# Patient Record
Sex: Male | Born: 1995 | Race: Black or African American | Hispanic: No | Marital: Single | Smoking: Never smoker
Health system: Southern US, Community
[De-identification: ages and names within clinical notes are randomized; demographics above are authoritative.]

## PROBLEM LIST (undated history)

## (undated) DIAGNOSIS — D550 Anemia due to glucose-6-phosphate dehydrogenase [G6PD] deficiency: Secondary | ICD-10-CM

---

## 2017-12-05 ENCOUNTER — Other Ambulatory Visit: Payer: Self-pay

## 2017-12-05 ENCOUNTER — Emergency Department (HOSPITAL_COMMUNITY)
Admission: EM | Admit: 2017-12-05 | Discharge: 2017-12-05 | Disposition: A | Discharge status: Home / Self Care | Payer: Self-pay | Attending: Emergency Medicine | Admitting: Emergency Medicine

## 2017-12-05 ENCOUNTER — Emergency Department (HOSPITAL_COMMUNITY): Payer: Self-pay

## 2017-12-05 ENCOUNTER — Encounter (HOSPITAL_COMMUNITY): Payer: Self-pay

## 2017-12-05 DIAGNOSIS — Y9259 Other trade areas as the place of occurrence of the external cause: Secondary | ICD-10-CM | POA: Insufficient documentation

## 2017-12-05 DIAGNOSIS — S0001XA Abrasion of scalp, initial encounter: Secondary | ICD-10-CM | POA: Insufficient documentation

## 2017-12-05 DIAGNOSIS — S0990XA Unspecified injury of head, initial encounter: Secondary | ICD-10-CM

## 2017-12-05 DIAGNOSIS — Y939 Activity, unspecified: Secondary | ICD-10-CM | POA: Insufficient documentation

## 2017-12-05 DIAGNOSIS — Y999 Unspecified external cause status: Secondary | ICD-10-CM | POA: Insufficient documentation

## 2017-12-05 DIAGNOSIS — S8000XA Contusion of unspecified knee, initial encounter: Secondary | ICD-10-CM | POA: Insufficient documentation

## 2017-12-05 DIAGNOSIS — S01511A Laceration without foreign body of lip, initial encounter: Secondary | ICD-10-CM | POA: Insufficient documentation

## 2017-12-05 HISTORY — DX: Anemia due to glucose-6-phosphate dehydrogenase (g6pd) deficiency: D55.0

## 2017-12-05 LAB — COMPREHENSIVE METABOLIC PANEL
ALT: 13 U/L (ref 0–44)
AST: 31 U/L (ref 15–41)
Albumin: 4.5 g/dL (ref 3.5–5.0)
Alkaline Phosphatase: 47 U/L (ref 38–126)
Anion gap: 13 (ref 5–15)
BUN: 13 mg/dL (ref 6–20)
CHLORIDE: 104 mmol/L (ref 98–111)
CO2: 23 mmol/L (ref 22–32)
Calcium: 9.6 mg/dL (ref 8.9–10.3)
Creatinine, Ser: 1.08 mg/dL (ref 0.61–1.24)
Glucose, Bld: 95 mg/dL (ref 70–99)
POTASSIUM: 4.2 mmol/L (ref 3.5–5.1)
Sodium: 140 mmol/L (ref 135–145)
Total Bilirubin: 1.2 mg/dL (ref 0.3–1.2)
Total Protein: 6.9 g/dL (ref 6.5–8.1)

## 2017-12-05 LAB — DIFFERENTIAL
ABS IMMATURE GRANULOCYTES: 0.1 10*3/uL (ref 0.0–0.1)
BASOS ABS: 0.1 10*3/uL (ref 0.0–0.1)
Basophils Relative: 0 %
EOS PCT: 0 %
Eosinophils Absolute: 0 10*3/uL (ref 0.0–0.7)
IMMATURE GRANULOCYTES: 1 %
LYMPHS ABS: 1.4 10*3/uL (ref 0.7–4.0)
LYMPHS PCT: 7 %
Monocytes Absolute: 1.3 10*3/uL — ABNORMAL HIGH (ref 0.1–1.0)
Monocytes Relative: 7 %
Neutro Abs: 16.4 10*3/uL — ABNORMAL HIGH (ref 1.7–7.7)
Neutrophils Relative %: 85 %

## 2017-12-05 LAB — CBC
HCT: 45.8 % (ref 39.0–52.0)
HEMOGLOBIN: 14.9 g/dL (ref 13.0–17.0)
MCH: 31.1 pg (ref 26.0–34.0)
MCHC: 32.5 g/dL (ref 30.0–36.0)
MCV: 95.6 fL (ref 78.0–100.0)
Platelets: 177 10*3/uL (ref 150–400)
RBC: 4.79 MIL/uL (ref 4.22–5.81)
RDW: 13.1 % (ref 11.5–15.5)
WBC: 19.2 10*3/uL — AB (ref 4.0–10.5)

## 2017-12-05 LAB — I-STAT TROPONIN, ED: TROPONIN I, POC: 0.01 ng/mL (ref 0.00–0.08)
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
## 2017-12-05 NOTE — Discharge Instructions (Addendum)
Ibuprofen 600 mg every 6 hours as needed for pain.  Return to the ER if you experience any new and/or concerning symptoms.
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
## 2017-12-05 NOTE — ED Notes (Signed)
Pt discharged from ED; instructions provided; Pt encouraged to return to ED if symptoms worsen and to f/u with PCP; Pt verbalized understanding of all instructions 
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
## 2024-05-26 ENCOUNTER — Emergency Department (HOSPITAL_COMMUNITY)
Admission: EM | Admit: 2024-05-26 | Discharge: 2024-05-27 | Disposition: A | Payer: Self-pay | Attending: Emergency Medicine | Admitting: Emergency Medicine

## 2024-05-26 ENCOUNTER — Encounter (HOSPITAL_COMMUNITY): Payer: Self-pay

## 2024-05-26 DIAGNOSIS — R21 Rash and other nonspecific skin eruption: Secondary | ICD-10-CM | POA: Insufficient documentation

## 2024-05-26 DIAGNOSIS — Z5321 Procedure and treatment not carried out due to patient leaving prior to being seen by health care provider: Secondary | ICD-10-CM | POA: Insufficient documentation

## 2024-05-26 NOTE — ED Triage Notes (Signed)
 Pt comes in for a rash on the arms. Rash has been there for a month. Pt came in tonight b/c abscess like pimples started to form on the right arm and his under arm. Pt states that started over a week ago. Pt thought is was getting better with steroid cream but the pimples started to show up.  Pt denies any new food, soaps or deodorants.  Pt did work at [employer] for a short time, right around the time the rash formed. Pt no longer works there. A&Ox4.

## 2024-05-27 ENCOUNTER — Encounter (HOSPITAL_COMMUNITY): Payer: Self-pay | Admitting: *Deleted

## 2024-05-27 ENCOUNTER — Other Ambulatory Visit: Payer: Self-pay

## 2024-05-27 ENCOUNTER — Emergency Department (HOSPITAL_COMMUNITY)
Admission: EM | Admit: 2024-05-27 | Discharge: 2024-05-27 | Disposition: A | Discharge status: Home / Self Care | Payer: Self-pay | Attending: Emergency Medicine | Admitting: Emergency Medicine

## 2024-05-27 DIAGNOSIS — L02511 Cutaneous abscess of right hand: Secondary | ICD-10-CM | POA: Insufficient documentation

## 2024-05-27 DIAGNOSIS — L02413 Cutaneous abscess of right upper limb: Secondary | ICD-10-CM | POA: Insufficient documentation

## 2024-05-27 DIAGNOSIS — L02411 Cutaneous abscess of right axilla: Secondary | ICD-10-CM | POA: Insufficient documentation

## 2024-05-27 DIAGNOSIS — L02419 Cutaneous abscess of limb, unspecified: Secondary | ICD-10-CM

## 2024-05-27 MED ORDER — DOXYCYCLINE HYCLATE 100 MG PO TABS
100.0000 mg | ORAL_TABLET | Freq: Once | ORAL | Status: AC
  Administered 2024-05-27: 100 mg via ORAL
  Filled 2024-05-27: qty 1

## 2024-05-27 MED ORDER — DOXYCYCLINE HYCLATE 100 MG PO CAPS: 100.0000 mg | ORAL_CAPSULE | Freq: Two times a day (BID) | ORAL | 0 refills | Status: AC

## 2024-05-27 MED ORDER — LIDOCAINE-EPINEPHRINE (PF) 2 %-1:200000 IJ SOLN
20.0000 mL | Freq: Once | INTRAMUSCULAR | Status: AC
  Administered 2024-05-27: 20 mL
  Filled 2024-05-27: qty 20

## 2024-05-27 MED ORDER — CEPHALEXIN 500 MG PO CAPS: 500.0000 mg | ORAL_CAPSULE | Freq: Four times a day (QID) | ORAL | 0 refills | Status: AC

## 2024-05-27 MED ORDER — LIDOCAINE-EPINEPHRINE (PF) 2 %-1:200000 IJ SOLN
2.0000 mL | INTRAMUSCULAR | Status: DC
  Administered 2024-05-27: 2 mL

## 2024-05-27 MED ORDER — CEPHALEXIN 500 MG PO CAPS
500.0000 mg | ORAL_CAPSULE | Freq: Once | ORAL | Status: AC
  Administered 2024-05-27: 500 mg via ORAL
  Filled 2024-05-27: qty 1

## 2024-05-27 NOTE — Discharge Instructions (Signed)
 Please follow-up closely with a primary care doctor on an outpatient basis.  Please take all antibiotics as directed.  Return to emergency department immediately for any new or worsening symptoms.  [facility] Primary Care Doctor List    Rollene Pesa, MD. Specialty: [facility] Medicine Contact information: [address]  [place] [address]  [phone]   Glendia Fielding, MD. Specialty: [facility] Medicine Contact information: [address]  [place] [address]  [phone]   Benita Outhouse, MD Specialty: Internal Medicine Contact information: [address] [place] [address]  [phone]   Darlyn Hurst, MD. Specialty: Internal Medicine Contact information: [address]  [place] [address]  [phone]    [facility] (Dr. Luke) Specialty: Family Medicine Contact information: [address]  [place] [address]  [phone]   Garnette Lolling, MD. Specialty: [facility] Medicine Contact information: [address]  [address]  [place] [address]  [phone]   Gaither Langton, MD. Specialty: Internal Medicine Contact information: [address]  [address]  [place] [address]  [phone]   [facility] Family Medicine: [address] [phone]  [place], Family medicine [address]  [phone]  [facility] [address] [place], [place] [phone]  [facility]: [address] [phone]    [facility]  [address] [place], [place] [address] [phone]  Services [facility] offers a variety of basic health services.  Services include but are not limited to: Blood pressure checks  Heart rate checks  Blood sugar checks  Urine analysis  Rapid strep tests  Pregnancy tests.  Health education and referrals  People needing more complex services will be directed to a physician online. Using  these virtual visits, doctors can evaluate and prescribe medicine and treatments. There will be no medication on-site, though [facility] will help patients fill their prescriptions at little to no cost.   For More information please go to: [email or web address]  Allergy and Asthma:    [address] [place] [phone]  Urology:  [address]  [place] [phone]  [facility]  [address] [place], [place] [phone]  Orthopedics   [address] [place], [place] [phone]  Endocrinology  [address] [place], [place] [phone]  Podiatry: [facility] [phone]

## 2024-05-27 NOTE — ED Triage Notes (Signed)
 Pt was seen yesterday for same complaint; pt c/o rash to right arm, hand and right axilla; pt states they are painful at times and states he had been using hydrocortisone cream with some relief

## 2024-05-27 NOTE — ED Provider Notes (Signed)
 " [place] EMERGENCY DEPARTMENT AT [facility] Provider Note   CSN: [id number] Arrival date & time: 05/27/24  9171     Patient presents with: Rash   Brent Benjamin is a 29 y.o. male.   Patient is a 29 year old male who presents emergency department the chief complaint of a rash to bilateral upper extremities.  He notes that this has been present for approximate the past month.  He has been placing hydrocortisone cream with improvement in his symptoms.  Patient notes that he has now developed areas of erythema and swelling noted along the dorsal aspect of the right hand, medial aspect of the right forearm, and right axilla.  He notes that these areas are painful to palpation.  He notes that he has had previous abscesses in the past.  He denies any fever or chills.   Rash      Prior to Admission medications  Not on File    Allergies: Patient has no known allergies.    Review of Systems  Skin:  Positive for rash.  All other systems reviewed and are negative.   Updated Vital Signs BP 125/80 (BP Location: Left Arm)   Pulse 82   Temp 97.9 F (36.6 C) (Oral)   Ht 5' 10 (1.778 m)   Wt 68 kg   SpO2 99%   BMI 21.52 kg/m   Physical Exam Vitals and nursing note reviewed.  Constitutional:      General: He is not in acute distress.    Appearance: Normal appearance. He is not ill-appearing.  HENT:     Head: Normocephalic and atraumatic.  Eyes:     Extraocular Movements: Extraocular movements intact.     Conjunctiva/sclera: Conjunctivae normal.     Pupils: Pupils are equal, round, and reactive to light.  Cardiovascular:     Rate and Rhythm: Normal rate and regular rhythm.     Pulses: Normal pulses.     Heart sounds: Normal heart sounds. No murmur heard.    No gallop.  Pulmonary:     Effort: Pulmonary effort is normal. No respiratory distress.     Breath sounds: Normal breath sounds. No stridor. No wheezing, rhonchi or rales.  Musculoskeletal:         General: Normal range of motion.     Cervical back: Normal range of motion and neck supple. No rigidity or tenderness.  Skin:    General: Skin is warm and dry.     Comments: Localized area of erythema and fluctuance noted along the dorsal aspect of the right hand along the web spacing between the 1st and 2nd digit, another area of localized erythema and fluctuance along the ulnar aspect of the forearm, both of these areas are small, no lymphatic streaking, localized area of induration noted along the axilla with no fluctuant areas, no active discharge throughout  Neurological:     General: No focal deficit present.     Mental Status: He is alert and oriented to person, place, and time. Mental status is at baseline.  Psychiatric:        Mood and Affect: Mood normal.        Behavior: Behavior normal.        Thought Content: Thought content normal.        Judgment: Judgment normal.     (all labs ordered are listed, but only abnormal results are displayed) Labs Reviewed - No data to display  EKG: None  Radiology: No results found.   .Incision and  Drainage  Date/Time: 05/27/2024 10:37 AM  Performed by: Daralene Lonni BIRCH, PA-C Authorized by: Daralene Lonni BIRCH, PA-C   Consent:    Consent obtained:  Verbal   Consent given by:  Patient   Risks, benefits, and alternatives were discussed: yes     Risks discussed:  Bleeding, incomplete drainage, pain and damage to other organs   Alternatives discussed:  No treatment, delayed treatment and alternative treatment Universal protocol:    Procedure explained and questions answered to patient or proxy's satisfaction: yes     Immediately prior to procedure, a time out was called: yes     Patient identity confirmed:  Verbally with patient, arm band, provided demographic data and hospital-assigned identification number Location:    Type:  Abscess   Size:  Mild   Location: Dorsal aspect of hand and medial aspect of  forearm. Pre-procedure details:    Skin preparation:  Povidone-iodine Anesthesia:    Anesthesia method:  Local infiltration   Local anesthetic:  2 mL lidocaine -EPINEPHrine  2 %-1:200000 Procedure type:    Complexity:  Simple Procedure details:    Ultrasound guidance: no     Needle aspiration: no     Incision types:  Single straight   Incision depth:  Dermal   Wound management:  Probed and deloculated   Drainage:  Purulent   Drainage amount:  Moderate   Wound treatment:  Wound left open   Packing materials:  None Post-procedure details:    Procedure completion:  Tolerated well, no immediate complications    Medications Ordered in the ED  cephALEXin  (KEFLEX ) capsule 500 mg (has no administration in time range)  doxycycline  (VIBRA -TABS) tablet 100 mg (has no administration in time range)  lidocaine -EPINEPHrine  (XYLOCAINE  W/EPI) 2 %-1:200000 (PF) injection 20 mL (has no administration in time range)                                    Medical Decision Making Patient is doing well at this time and is stable for discharge home.  Abscess sites to the right hand and forearm were drained.  Abscess sites to the right axilla were not amenable to drainage at this point as they were only indurated with no areas of fluctuance.  Discussed the need for continued warm compresses over all sites.  He has no associated lymphatic streaking.  Vital signs are stable with no indication for sepsis.  He has no indication for necrotizing fasciitis.  There was no indication for flexor tenosynovitis to the abscess site to the hand and no indication for deep involvement.  Compartments were soft to the hand with no apparent involvement of the compartments.  Will place on outpatient antibiotics.  He does have a history of G6PD and will avoid Bactrim.  Close follow-up with PCP was discussed as well as strict turn precautions for any new or worsening symptoms.  Patient voiced understanding and had no additional  questions.  Risk Prescription drug management.        Final diagnoses:  None    ED Discharge Orders     None          Daralene Lonni BIRCH DEVONNA 05/27/24 1039    Suzette Pac, MD 05/27/24 2050  "
